# Patient Record
Sex: Female | Born: 1945 | Race: White | Hispanic: No | Marital: Married | Smoking: Former smoker
Health system: Southern US, Community
[De-identification: ages and names within clinical notes are randomized; demographics above are authoritative.]

## PROBLEM LIST (undated history)

## (undated) DIAGNOSIS — K5792 Diverticulitis of intestine, part unspecified, without perforation or abscess without bleeding: Secondary | ICD-10-CM

## (undated) DIAGNOSIS — T7840XA Allergy, unspecified, initial encounter: Secondary | ICD-10-CM

## (undated) DIAGNOSIS — C801 Malignant (primary) neoplasm, unspecified: Secondary | ICD-10-CM

## (undated) DIAGNOSIS — E079 Disorder of thyroid, unspecified: Secondary | ICD-10-CM

## (undated) HISTORY — DX: Diverticulitis of intestine, part unspecified, without perforation or abscess without bleeding: K57.92

## (undated) HISTORY — DX: Disorder of thyroid, unspecified: E07.9

## (undated) HISTORY — DX: Malignant (primary) neoplasm, unspecified: C80.1

## (undated) HISTORY — PX: PARATHYROIDECTOMY: SHX19

## (undated) HISTORY — DX: Allergy, unspecified, initial encounter: T78.40XA

---

## 1995-08-25 HISTORY — PX: BREAST BIOPSY: SHX20

## 2008-08-24 DIAGNOSIS — C801 Malignant (primary) neoplasm, unspecified: Secondary | ICD-10-CM

## 2008-08-24 HISTORY — DX: Malignant (primary) neoplasm, unspecified: C80.1

## 2008-08-24 HISTORY — PX: THYROIDECTOMY: SHX17

## 2012-01-28 ENCOUNTER — Ambulatory Visit: Payer: Self-pay | Admitting: Internal Medicine

## 2012-01-28 LAB — HM DEXA SCAN

## 2012-01-28 LAB — HM MAMMOGRAPHY: HM MAMMO: NEGATIVE

## 2012-06-17 LAB — HM COLONOSCOPY

## 2012-07-18 ENCOUNTER — Ambulatory Visit: Payer: Self-pay | Admitting: Gastroenterology

## 2013-10-27 DIAGNOSIS — C73 Malignant neoplasm of thyroid gland: Secondary | ICD-10-CM | POA: Diagnosis not present

## 2013-11-06 DIAGNOSIS — C73 Malignant neoplasm of thyroid gland: Secondary | ICD-10-CM | POA: Diagnosis not present

## 2013-11-06 DIAGNOSIS — E890 Postprocedural hypothyroidism: Secondary | ICD-10-CM | POA: Diagnosis not present

## 2014-05-25 DIAGNOSIS — Z23 Encounter for immunization: Secondary | ICD-10-CM | POA: Diagnosis not present

## 2014-06-19 ENCOUNTER — Ambulatory Visit (INDEPENDENT_AMBULATORY_CARE_PROVIDER_SITE_OTHER): Payer: Medicare Other | Admitting: Family Medicine

## 2014-06-19 ENCOUNTER — Encounter (INDEPENDENT_AMBULATORY_CARE_PROVIDER_SITE_OTHER): Payer: Self-pay

## 2014-06-19 ENCOUNTER — Encounter: Payer: Self-pay | Admitting: Family Medicine

## 2014-06-19 VITALS — BP 118/68 | HR 71 | Temp 97.9°F | Ht 64.25 in | Wt 145.2 lb

## 2014-06-19 DIAGNOSIS — K5730 Diverticulosis of large intestine without perforation or abscess without bleeding: Secondary | ICD-10-CM

## 2014-06-19 DIAGNOSIS — E039 Hypothyroidism, unspecified: Secondary | ICD-10-CM | POA: Diagnosis not present

## 2014-06-19 DIAGNOSIS — Z85850 Personal history of malignant neoplasm of thyroid: Secondary | ICD-10-CM

## 2014-06-19 DIAGNOSIS — Z8639 Personal history of other endocrine, nutritional and metabolic disease: Secondary | ICD-10-CM

## 2014-06-19 DIAGNOSIS — M810 Age-related osteoporosis without current pathological fracture: Secondary | ICD-10-CM | POA: Diagnosis not present

## 2014-06-19 NOTE — Assessment & Plan Note (Signed)
Followed by Dr. Gabriel Carina.  On thyroid replacement.  Clinically euthyroid.

## 2014-06-19 NOTE — Patient Instructions (Addendum)
It was great to meet you. Please return tomorrow for your welcome to medicare physical at 9:30.

## 2014-06-19 NOTE — Progress Notes (Signed)
Pre visit review using our clinic review tool, if applicable. No additional management support is needed unless otherwise documented below in the visit note. 

## 2014-06-19 NOTE — Progress Notes (Signed)
Subjective:    Patient ID: Amber Andrews, female    DOB: [birth year], 68 y.o.   MRN: [id number]  HPI  Very pleasant retired Therapist, sports here to establish care.  Osteoporosis- Diagnosed with osteoporosis in 2009- dramatic decrease in bone density and started on Fosamax.  Has been on it since. Last bone density was 01/28/2012.  At that time, she was found to have hyperparathyroidism which likely contributed to her low bone mass.  Has never had any fractures. Very active- walks daily.  Healthy, balanced diet.  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 Now on thyroid replacement therapy and followed by Dr. Gabriel Carina.  Was being seen twice a year but now yearly since she just passed the 5 year mark of her cancer diagnosis.   She travels with her husband- going to [place] and [place] 4 months this winter.  Diverticulosis- diagnosed on colonoscopy in 2013.  Takes fiber supplement which has helped.  Denies changes in her bowel habits or blood in her stool.  Due for medicare wellness visit before 06/24/2014  No current outpatient prescriptions on file prior to visit.   No current facility-administered medications on file prior to visit.    Allergies  Allergen Reactions  . Sulfa Antibiotics Hives    Past Medical History  Diagnosis Date  . Cancer 2010    thyroid  . Diverticulitis   . Allergy   . Thyroid disease     Past Surgical History  Procedure Laterality Date  . Breast biopsy  1997  . Thyroidectomy  2010  . Parathyroidectomy      Family History  Problem Relation Age of Onset  . Arthritis Mother   . Hypertension Mother   . Hypertension Father   . Arthritis Sister   . Cancer Maternal Uncle   . Cancer Paternal Uncle   . Heart disease Maternal Grandfather     History   Social History  . Marital Status: Married    Spouse Name: N/A    Number of Children: N/A  . Years of Education: N/A   Occupational History  . Not on file.    Social History Main Topics  . Smoking status: Former Research scientist (life sciences)  . Smokeless tobacco: Never Used  . Alcohol Use: Yes  . Drug Use: No  . Sexual Activity: Yes   Other Topics Concern  . Not on file   Social History Narrative  . No narrative on file   The PMH, PSH, Social History, Family History, Medications, and allergies have been reviewed in [facility], and have been updated if relevant.   Review of Systems  Constitutional: Negative.   HENT: Negative.   Respiratory: Negative.   Cardiovascular: Negative.   Gastrointestinal: Negative.   Psychiatric/Behavioral: Negative.   All other systems reviewed and are negative.      Objective:   Physical Exam  Nursing note and vitals reviewed. Constitutional: She is oriented to person, place, and time. She appears well-developed and well-nourished. No distress.  HENT:  Head: Normocephalic.  Cardiovascular: Normal rate.   Pulmonary/Chest: Effort normal.  Musculoskeletal: Normal range of motion.  Neurological: She is alert and oriented to person, place, and time.  Skin: Skin is warm and dry.  Psychiatric: She has a normal mood and affect. Her behavior is normal. Thought content normal.   BP 118/68  Pulse 71  Temp(Src) 97.9 F (36.6 C) (Oral)  Ht 5' 4.25" (1.632 m)  Wt 145 lb 4 oz (65.885 kg)  BMI 24.74 kg/m2  SpO2 97%        Assessment & Plan:

## 2014-06-19 NOTE — Assessment & Plan Note (Signed)
Colonoscopy reassuring. Asymptomatic.

## 2014-06-19 NOTE — Assessment & Plan Note (Signed)
Due for DEXA.  Can likely stop fosamax at this point given that she has been taking it since 2009.

## 2014-06-20 ENCOUNTER — Encounter: Payer: Self-pay | Admitting: Family Medicine

## 2014-06-20 ENCOUNTER — Ambulatory Visit (INDEPENDENT_AMBULATORY_CARE_PROVIDER_SITE_OTHER): Payer: Medicare Other | Admitting: Family Medicine

## 2014-06-20 ENCOUNTER — Other Ambulatory Visit (HOSPITAL_COMMUNITY)
Admission: RE | Admit: 2014-06-20 | Discharge: 2014-06-20 | Disposition: A | Discharge status: Home / Self Care | Payer: Medicare Other | Source: Ambulatory Visit | Attending: Family Medicine | Admitting: Family Medicine

## 2014-06-20 VITALS — BP 114/70 | HR 80 | Temp 97.7°F | Ht 64.25 in | Wt 143.5 lb

## 2014-06-20 DIAGNOSIS — Z0000 Encounter for general adult medical examination without abnormal findings: Secondary | ICD-10-CM | POA: Diagnosis not present

## 2014-06-20 DIAGNOSIS — Z23 Encounter for immunization: Secondary | ICD-10-CM | POA: Diagnosis not present

## 2014-06-20 DIAGNOSIS — Z85850 Personal history of malignant neoplasm of thyroid: Secondary | ICD-10-CM

## 2014-06-20 DIAGNOSIS — Z124 Encounter for screening for malignant neoplasm of cervix: Secondary | ICD-10-CM

## 2014-06-20 DIAGNOSIS — Z8639 Personal history of other endocrine, nutritional and metabolic disease: Secondary | ICD-10-CM

## 2014-06-20 DIAGNOSIS — Z13220 Encounter for screening for lipoid disorders: Secondary | ICD-10-CM

## 2014-06-20 DIAGNOSIS — K5730 Diverticulosis of large intestine without perforation or abscess without bleeding: Secondary | ICD-10-CM

## 2014-06-20 DIAGNOSIS — E039 Hypothyroidism, unspecified: Secondary | ICD-10-CM

## 2014-06-20 DIAGNOSIS — M810 Age-related osteoporosis without current pathological fracture: Secondary | ICD-10-CM

## 2014-06-20 DIAGNOSIS — Z1231 Encounter for screening mammogram for malignant neoplasm of breast: Secondary | ICD-10-CM

## 2014-06-20 DIAGNOSIS — Z1151 Encounter for screening for human papillomavirus (HPV): Secondary | ICD-10-CM | POA: Diagnosis not present

## 2014-06-20 LAB — CBC WITH DIFFERENTIAL/PLATELET
BASOS ABS: 0 10*3/uL (ref 0.0–0.1)
Basophils Relative: 0.3 % (ref 0.0–3.0)
EOS ABS: 0.1 10*3/uL (ref 0.0–0.7)
Eosinophils Relative: 1.2 % (ref 0.0–5.0)
HCT: 41.5 % (ref 36.0–46.0)
Hemoglobin: 13.7 g/dL (ref 12.0–15.0)
LYMPHS PCT: 30.2 % (ref 12.0–46.0)
Lymphs Abs: 1.4 10*3/uL (ref 0.7–4.0)
MCHC: 33 g/dL (ref 30.0–36.0)
MCV: 93.7 fl (ref 78.0–100.0)
MONOS PCT: 7.7 % (ref 3.0–12.0)
Monocytes Absolute: 0.4 10*3/uL (ref 0.1–1.0)
NEUTROS PCT: 60.6 % (ref 43.0–77.0)
Neutro Abs: 2.9 10*3/uL (ref 1.4–7.7)
Platelets: 237 10*3/uL (ref 150.0–400.0)
RBC: 4.43 Mil/uL (ref 3.87–5.11)
RDW: 13.7 % (ref 11.5–15.5)
WBC: 4.8 10*3/uL (ref 4.0–10.5)

## 2014-06-20 LAB — LIPID PANEL
Cholesterol: 232 mg/dL — ABNORMAL HIGH (ref 0–200)
HDL: 92.1 mg/dL (ref >39.00)
LDL Cholesterol: 123 mg/dL — ABNORMAL HIGH (ref 0–99)
NonHDL: 139.9
Total CHOL/HDL Ratio: 3
Triglycerides: 87 mg/dL (ref 0.0–149.0)
VLDL: 17.4 mg/dL (ref 0.0–40.0)

## 2014-06-20 LAB — COMPREHENSIVE METABOLIC PANEL
ALT: 16 U/L (ref 0–35)
AST: 19 U/L (ref 0–37)
Albumin: 3.5 g/dL (ref 3.5–5.2)
Alkaline Phosphatase: 33 U/L — ABNORMAL LOW (ref 39–117)
BUN: 11 mg/dL (ref 6–23)
CALCIUM: 8.7 mg/dL (ref 8.4–10.5)
CO2: 26 meq/L (ref 19–32)
Chloride: 104 mEq/L (ref 96–112)
Creatinine, Ser: 0.8 mg/dL (ref 0.4–1.2)
GFR: 78.55 mL/min (ref >60.00)
Glucose, Bld: 83 mg/dL (ref 70–99)
POTASSIUM: 3.7 meq/L (ref 3.5–5.1)
SODIUM: 138 meq/L (ref 135–145)
TOTAL PROTEIN: 6.8 g/dL (ref 6.0–8.3)
Total Bilirubin: 0.9 mg/dL (ref 0.2–1.2)

## 2014-06-20 LAB — TSH: TSH: 0.18 u[IU]/mL — AB (ref 0.35–4.50)

## 2014-06-20 NOTE — Progress Notes (Signed)
Pre visit review using our clinic review tool, if applicable. No additional management support is needed unless otherwise documented below in the visit note. 

## 2014-06-20 NOTE — Patient Instructions (Signed)
Great to see you. We will call you with your lab results and you can view them online. Please stop by to see Rosaria Ferries to set up your mammogram and bone density. Safe and happy travels!

## 2014-06-20 NOTE — Assessment & Plan Note (Signed)
Followed by Dr. Gabriel Carina. Clinically euthyroid.

## 2014-06-20 NOTE — Assessment & Plan Note (Signed)
Reviewed preventive care protocols, scheduled due services, and updated immunizations Discussed nutrition, exercise, diet, and healthy lifestyle.  EKG- NSR with some nonspecific T waves changes- no prior EKG for comparison but this is likely a normal variant.  No CP, SOB or dizziness- even at high altitudes during her recent hikes and travels.  Pneumovax given today.  Mammogram and DEXA ordered. Orders Placed This Encounter  Procedures  . DG Bone Density  . MM Digital Screening  . Pneumococcal polysaccharide vaccine 23-valent greater than or equal to 2yo subcutaneous/IM  . CBC with Differential  . Comprehensive metabolic panel  . Lipid panel  . TSH  . EKG 12-Lead

## 2014-06-20 NOTE — Assessment & Plan Note (Signed)
On fosamax. Due for drug holiday from bisphosphonate.  Will recheck DEXA. Order placed.

## 2014-06-20 NOTE — Addendum Note (Signed)
Addended by: Modena Nunnery on: 06/20/2014 10:28 AM   Modules accepted: Orders

## 2014-06-20 NOTE — Progress Notes (Signed)
Subjective:    Patient ID: Amber Andrews, female    DOB: 1946-01-25, 68 y.o.   MRN: [id number]  HPI  Very pleasant 68 yo female who established care with me yesterday here for Welcome to Medicare Physical.  I have personally reviewed the Medicare Annual Wellness questionnaire and have noted 1. The patient's medical and social history 2. Their use of alcohol, tobacco or illicit drugs 3. Their current medications and supplements 4. The patient's functional ability including ADL's, fall risks, home safety risks and hearing or visual             impairment. 5. Diet and physical activities 6. Evidence for depression or mood disorders  End of life wishes discussed and updated in Social History.  The roster of all physicians providing medical care to patient - is listed in the Snapshot section of the chart.  Zoster 11/14/12 Flu 05/24/14 Colonoscopy 06/17/12- Dr. Candace Cruise, 10 year recall  No h/o abnormal pap smears. Did have some postmenopausal bleeding year ago- per pt, endometrial biopsy neg.  Osteoporosis- Diagnosed with osteoporosis in 2009- dramatic decrease in bone density and started on Fosamax.  Has been on it since. Last bone density was 01/28/2012.  At that time, she was found to have hyperparathyroidism which likely contributed to her low bone mass.  Has never had any fractures. Very active- walks daily.  Healthy, balanced diet.  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 Now on thyroid replacement therapy and followed by Dr. Gabriel Carina.  Was being seen twice a year but now yearly since she just passed the 5 year mark of her cancer diagnosis.   She travels with her husband- going to [place] and [place] 4 months this winter.  Diverticulosis- diagnosed on colonoscopy in 2013.  Takes fiber supplement which has helped.  Denies changes in her bowel habits or blood in her stool.   Current Outpatient Prescriptions on File Prior to Visit    Medication Sig Dispense Refill  . alendronate (FOSAMAX) 70 MG tablet Take 70 mg by mouth once a week. Take with a full glass of water on an empty stomach.      Marland Kitchen CALCIUM CITRATE PO Take 2 tablets by mouth daily. 630 mg      . FIBER PO Take by mouth daily.      Marland Kitchen levothyroxine (SYNTHROID, LEVOTHROID) 112 MCG tablet Take 112 mcg by mouth 3 (three) times a week. Take one tab Friday, Saturday and Sunday      . levothyroxine (SYNTHROID, LEVOTHROID) 125 MCG tablet Take 125 mcg by mouth 4 (four) times a week. Take one tab daily Monday through Thursday.      . Multiple Vitamins-Minerals (CENTRUM SILVER ADULT 50+) TABS Take 1 tablet by mouth daily.      . Omega-3 Fatty Acids (FISH OIL PO) Take 1 capsule by mouth.       No current facility-administered medications on file prior to visit.    Allergies  Allergen Reactions  . Sulfa Antibiotics Hives    Past Medical History  Diagnosis Date  . Cancer 2010    thyroid  . Diverticulitis   . Allergy   . Thyroid disease     Past Surgical History  Procedure Laterality Date  . Breast biopsy  1997  . Thyroidectomy  2010  . Parathyroidectomy      Family History  Problem Relation Age of Onset  . Arthritis Mother   . Hypertension Mother   . Hypertension Father   .  Arthritis Sister   . Cancer Maternal Uncle   . Cancer Paternal Uncle   . Heart disease Maternal Grandfather     History   Social History  . Marital Status: Married    Spouse Name: N/A    Number of Children: N/A  . Years of Education: N/A   Occupational History  . Not on file.   Social History Main Topics  . Smoking status: Former Research scientist (life sciences)  . Smokeless tobacco: Never Used  . Alcohol Use: Yes  . Drug Use: No  . Sexual Activity: Yes   Other Topics Concern  . Not on file   Social History Narrative  . No narrative on file   The PMH, PSH, Social History, Family History, Medications, and allergies have been reviewed in [facility], and have been updated if relevant.   Review  of Systems  Constitutional: Negative.   HENT: Negative.   Respiratory: Negative.   Cardiovascular: Negative.  Negative for chest pain, palpitations and leg swelling.  Gastrointestinal: Negative.  Negative for abdominal pain, constipation, blood in stool, abdominal distention and anal bleeding.  Endocrine: Negative.   Genitourinary: Negative.   Musculoskeletal: Negative.   Skin: Negative.   Allergic/Immunologic: Negative.   Psychiatric/Behavioral: Negative.   All other systems reviewed and are negative.      Objective:   Physical Exam  Nursing note and vitals reviewed. Constitutional: She is oriented to person, place, and time. She appears well-developed and well-nourished. No distress.  HENT:  Head: Normocephalic.  Cardiovascular: Normal rate.   Pulmonary/Chest: Effort normal.  Abdominal: Hernia confirmed negative in the right inguinal area and confirmed negative in the left inguinal area.  Genitourinary: Rectum normal, vagina normal and uterus normal. No breast swelling, tenderness, discharge or bleeding. Pelvic exam was performed with patient prone. There is no rash, tenderness or lesion on the right labia. There is no rash, tenderness or lesion on the left labia. Cervix exhibits no motion tenderness, no discharge and no friability. Right adnexum displays no mass, no tenderness and no fullness. Left adnexum displays no mass, no tenderness and no fullness.  Musculoskeletal: Normal range of motion.  Lymphadenopathy:       Right: No inguinal adenopathy present.       Left: No inguinal adenopathy present.  Neurological: She is alert and oriented to person, place, and time.  Skin: Skin is warm and dry.  Psychiatric: She has a normal mood and affect. Her behavior is normal. Thought content normal.   BP 114/70  Pulse 80  Temp(Src) 97.7 F (36.5 C) (Oral)  Ht 5' 4.25" (1.632 m)  Wt 143 lb 8 oz (65.091 kg)  BMI 24.44 kg/m2  SpO2 95%        Assessment & Plan:

## 2014-06-21 ENCOUNTER — Encounter: Payer: Self-pay | Admitting: *Deleted

## 2014-06-21 LAB — CYTOLOGY - PAP

## 2014-06-22 ENCOUNTER — Encounter: Payer: Self-pay | Admitting: *Deleted

## 2014-07-04 ENCOUNTER — Ambulatory Visit: Payer: Self-pay | Admitting: Family Medicine

## 2014-07-04 ENCOUNTER — Encounter: Payer: Self-pay | Admitting: Family Medicine

## 2014-07-04 DIAGNOSIS — Z1231 Encounter for screening mammogram for malignant neoplasm of breast: Secondary | ICD-10-CM | POA: Diagnosis not present

## 2014-07-04 DIAGNOSIS — M810 Age-related osteoporosis without current pathological fracture: Secondary | ICD-10-CM | POA: Diagnosis not present

## 2014-07-04 DIAGNOSIS — Z13820 Encounter for screening for osteoporosis: Secondary | ICD-10-CM | POA: Diagnosis not present

## 2014-07-04 DIAGNOSIS — Z780 Asymptomatic menopausal state: Secondary | ICD-10-CM | POA: Diagnosis not present

## 2014-07-05 ENCOUNTER — Encounter: Payer: Self-pay | Admitting: Family Medicine

## 2014-07-06 ENCOUNTER — Encounter: Payer: Self-pay | Admitting: Family Medicine

## 2014-11-29 ENCOUNTER — Other Ambulatory Visit: Payer: Self-pay

## 2014-11-29 DIAGNOSIS — Z85850 Personal history of malignant neoplasm of thyroid: Secondary | ICD-10-CM | POA: Diagnosis not present

## 2014-11-29 NOTE — Telephone Encounter (Signed)
Pt left note requesting refill fosamax to [facility]. Pt last annual exam 06/20/14 and last Dexa 07/04/14.Please advise.

## 2014-11-30 MED ORDER — ALENDRONATE SODIUM 70 MG PO TABS: 70.0000 mg | ORAL_TABLET | ORAL | Status: DC

## 2014-12-19 DIAGNOSIS — E890 Postprocedural hypothyroidism: Secondary | ICD-10-CM | POA: Diagnosis not present

## 2014-12-19 DIAGNOSIS — Z85850 Personal history of malignant neoplasm of thyroid: Secondary | ICD-10-CM | POA: Diagnosis not present

## 2014-12-21 ENCOUNTER — Encounter: Payer: Self-pay | Admitting: Family Medicine

## 2014-12-21 MED ORDER — ALENDRONATE SODIUM 70 MG PO TABS
70.0000 mg | ORAL_TABLET | ORAL | Status: DC
Start: 2014-12-21 — End: 2015-03-06

## 2015-02-19 DIAGNOSIS — Z85850 Personal history of malignant neoplasm of thyroid: Secondary | ICD-10-CM | POA: Diagnosis not present

## 2015-02-27 DIAGNOSIS — Z85850 Personal history of malignant neoplasm of thyroid: Secondary | ICD-10-CM | POA: Diagnosis not present

## 2015-02-27 DIAGNOSIS — E890 Postprocedural hypothyroidism: Secondary | ICD-10-CM | POA: Diagnosis not present

## 2015-02-27 DIAGNOSIS — L659 Nonscarring hair loss, unspecified: Secondary | ICD-10-CM | POA: Diagnosis not present

## 2015-03-06 ENCOUNTER — Other Ambulatory Visit: Payer: Self-pay | Admitting: Family Medicine

## 2015-07-02 DIAGNOSIS — Z23 Encounter for immunization: Secondary | ICD-10-CM | POA: Diagnosis not present

## 2015-10-07 ENCOUNTER — Telehealth: Payer: Self-pay | Admitting: Family Medicine

## 2015-10-07 NOTE — Telephone Encounter (Signed)
Pt made cpx appointment for may 1 and would like to do labs somewhere else She would like to pick up order for labs around 4/10 when she will be in town/

## 2015-10-10 NOTE — Telephone Encounter (Signed)
Order written and in my box. 

## 2015-10-10 NOTE — Telephone Encounter (Signed)
Lm on pts vm and informed her orders are available for pickup from the front desk 

## 2015-11-25 DIAGNOSIS — E890 Postprocedural hypothyroidism: Secondary | ICD-10-CM | POA: Diagnosis not present

## 2015-11-25 DIAGNOSIS — Z85850 Personal history of malignant neoplasm of thyroid: Secondary | ICD-10-CM | POA: Diagnosis not present

## 2015-12-02 ENCOUNTER — Encounter: Payer: Medicare Other | Admitting: Family Medicine

## 2015-12-02 DIAGNOSIS — H43813 Vitreous degeneration, bilateral: Secondary | ICD-10-CM | POA: Diagnosis not present

## 2015-12-02 DIAGNOSIS — Z85850 Personal history of malignant neoplasm of thyroid: Secondary | ICD-10-CM | POA: Diagnosis not present

## 2015-12-02 DIAGNOSIS — E890 Postprocedural hypothyroidism: Secondary | ICD-10-CM | POA: Diagnosis not present

## 2015-12-13 DIAGNOSIS — E039 Hypothyroidism, unspecified: Secondary | ICD-10-CM | POA: Diagnosis not present

## 2015-12-13 DIAGNOSIS — Z0000 Encounter for general adult medical examination without abnormal findings: Secondary | ICD-10-CM | POA: Diagnosis not present

## 2015-12-23 ENCOUNTER — Ambulatory Visit (INDEPENDENT_AMBULATORY_CARE_PROVIDER_SITE_OTHER): Payer: Medicare Other | Admitting: Family Medicine

## 2015-12-23 ENCOUNTER — Encounter: Payer: Self-pay | Admitting: Family Medicine

## 2015-12-23 VITALS — BP 112/76 | HR 64 | Temp 97.6°F | Ht 64.0 in | Wt 138.5 lb

## 2015-12-23 DIAGNOSIS — Z0000 Encounter for general adult medical examination without abnormal findings: Secondary | ICD-10-CM | POA: Insufficient documentation

## 2015-12-23 DIAGNOSIS — Z8639 Personal history of other endocrine, nutritional and metabolic disease: Secondary | ICD-10-CM

## 2015-12-23 DIAGNOSIS — K5730 Diverticulosis of large intestine without perforation or abscess without bleeding: Secondary | ICD-10-CM | POA: Diagnosis not present

## 2015-12-23 DIAGNOSIS — Z23 Encounter for immunization: Secondary | ICD-10-CM | POA: Diagnosis not present

## 2015-12-23 DIAGNOSIS — Z85850 Personal history of malignant neoplasm of thyroid: Secondary | ICD-10-CM

## 2015-12-23 DIAGNOSIS — E039 Hypothyroidism, unspecified: Secondary | ICD-10-CM

## 2015-12-23 DIAGNOSIS — M810 Age-related osteoporosis without current pathological fracture: Secondary | ICD-10-CM

## 2015-12-23 MED ORDER — ALENDRONATE SODIUM 70 MG PO TABS: ORAL_TABLET | ORAL | Status: AC

## 2015-12-23 NOTE — Patient Instructions (Signed)
Good to see you. We are requesting your records. Please call the breast center to schedule your bone density and mammogram around 06/2016.  Safe travels!

## 2015-12-23 NOTE — Addendum Note (Signed)
Addended by: Lucille Passy on: 12/23/2015 11:21 AM   Modules accepted: Miquel Dunn

## 2015-12-23 NOTE — Assessment & Plan Note (Signed)
Continue fosamax. Due for DEXA this fall.

## 2015-12-23 NOTE — Assessment & Plan Note (Signed)
Followed by Dr. Solum. Clinically euthyroid. 

## 2015-12-23 NOTE — Progress Notes (Signed)
Subjective:    Patient ID: Amber Andrews, female    DOB: May 29, 1946, 70 y.o.   MRN: [id number]  HPI  Very pleasant 70 up female here for annual medicare wellness visit.  I have personally reviewed the Medicare Annual Wellness questionnaire and have noted 1. The patient's medical and social history 2. Their use of alcohol, tobacco or illicit drugs 3. Their current medications and supplements 4. The patient's functional ability including ADL's, fall risks, home safety risks and hearing or visual             impairment. 5. Diet and physical activities 6. Evidence for depression or mood disorders  End of life wishes discussed and updated in Social History.  The roster of all physicians providing medical care to patient - is listed in the Snapshot section of the chart.  Zoster 11/14/12 Pneumovax 06/20/14 Colonoscopy 06/17/12- Dr. Candace Cruise, 10 year recall Mammogram 07/04/14  No h/o abnormal pap smears. Last pap smear was done by me on 06/20/14 Did have some postmenopausal bleeding years ago- per pt, endometrial biopsy neg.  Osteoporosis- Diagnosed with osteoporosis in 2009- dramatic decrease in bone density and started on Fosamax.  Has been on it since. Last bone density was 07/04/14- osteopenia  Has never had any fractures. Very active- walks daily.  Healthy, balanced diet.  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 Now on thyroid replacement therapy and followed by Dr. Gabriel Carina yearly. Lab Results  Component Value Date   TSH 0.18* 06/20/2014     Current Outpatient Prescriptions on File Prior to Visit  Medication Sig Dispense Refill  . alendronate (FOSAMAX) 70 MG tablet TAKE 1 TABLET EVERY WEEK WITH A FULL GLASS OF WATER ON AN EMPTY STOMACH 12 tablet 1  . CALCIUM CITRATE PO Take 2 tablets by mouth daily. 630 mg    . FIBER PO Take by mouth daily.    Marland Kitchen levothyroxine (SYNTHROID, LEVOTHROID) 112 MCG tablet Take 112 mcg by mouth 3 (three)  times a week. Take one tab Friday, Saturday and Sunday    . Multiple Vitamins-Minerals (CENTRUM SILVER ADULT 50+) TABS Take 1 tablet by mouth daily.    . Omega-3 Fatty Acids (FISH OIL PO) Take 1 capsule by mouth.     No current facility-administered medications on file prior to visit.    Allergies  Allergen Reactions  . Sulfa Antibiotics Hives    Past Medical History  Diagnosis Date  . Cancer ([place]) 2010    thyroid  . Diverticulitis   . Allergy   . Thyroid disease     Past Surgical History  Procedure Laterality Date  . Breast biopsy  1997  . Thyroidectomy  2010  . Parathyroidectomy      Family History  Problem Relation Age of Onset  . Arthritis Mother   . Hypertension Mother   . Hypertension Father   . Arthritis Sister   . Cancer Maternal Uncle   . Cancer Paternal Uncle   . Heart disease Maternal Grandfather     Social History   Social History  . Marital Status: Married    Spouse Name: N/A  . Number of Children: N/A  . Years of Education: N/A   Occupational History  . Not on file.   Social History Main Topics  . Smoking status: Former Research scientist (life sciences)  . Smokeless tobacco: Never Used  . Alcohol Use: Yes  . Drug Use: No  . Sexual Activity: Yes   Other Topics Concern  . Not on  file   Social History Narrative   Has a living will   Desires CPR but no measures to prolong life if futile.   Husband and children aware of her wishes.      Retired Therapist, sports.   Loves to travel with her husband.   The PMH, PSH, Social History, Family History, Medications, and allergies have been reviewed in [facility], and have been updated if relevant.   Review of Systems  Constitutional: Negative.   HENT: Negative.   Respiratory: Negative.   Cardiovascular: Negative.  Negative for chest pain, palpitations and leg swelling.  Gastrointestinal: Negative.  Negative for abdominal pain, constipation, blood in stool, abdominal distention and anal bleeding.  Endocrine: Negative.   Genitourinary:  Negative.   Musculoskeletal: Negative.   Skin: Negative.   Allergic/Immunologic: Negative.   Psychiatric/Behavioral: Negative.   All other systems reviewed and are negative.      Objective:   Physical Exam  Constitutional: She is oriented to person, place, and time. She appears well-developed and well-nourished. No distress.  HENT:  Head: Normocephalic and atraumatic.  Right Ear: External ear normal.  Left Ear: External ear normal.  Eyes: Conjunctivae and EOM are normal.  Neck: Normal range of motion.  Cardiovascular: Normal rate.   Pulmonary/Chest: Effort normal.  Abdominal: Soft. She exhibits no distension. There is no tenderness. There is no rebound and no guarding.  Genitourinary: Rectum normal. Pelvic exam was performed with patient prone.  Musculoskeletal: Normal range of motion.  Neurological: She is alert and oriented to person, place, and time. No cranial nerve deficit.  Skin: Skin is warm and dry.  Psychiatric: She has a normal mood and affect. Her behavior is normal. Thought content normal.  Nursing note and vitals reviewed.  BP 112/76 mmHg  Pulse 64  Temp(Src) 97.6 F (36.4 C) (Oral)  Ht 5\' 4"  (1.626 m)  Wt 138 lb 8 oz (62.823 kg)  BMI 23.76 kg/m2  SpO2 98%        Assessment & Plan:

## 2015-12-23 NOTE — Assessment & Plan Note (Signed)
The patients weight, height, BMI and visual acuity have been recorded in the chart.  Cognitive function assessed.   I have made referrals, counseling and provided education to the patient based review of the above and I have provided the pt with a written personalized care plan for preventive services.  Prevnar 13 given today.   

## 2016-05-24 DIAGNOSIS — Z23 Encounter for immunization: Secondary | ICD-10-CM | POA: Diagnosis not present

## 2016-05-26 DIAGNOSIS — Z1231 Encounter for screening mammogram for malignant neoplasm of breast: Secondary | ICD-10-CM | POA: Diagnosis not present

## 2016-05-26 DIAGNOSIS — Z780 Asymptomatic menopausal state: Secondary | ICD-10-CM | POA: Diagnosis not present

## 2016-05-26 DIAGNOSIS — M8589 Other specified disorders of bone density and structure, multiple sites: Secondary | ICD-10-CM | POA: Diagnosis not present

## 2016-06-03 ENCOUNTER — Encounter: Payer: Self-pay | Admitting: Family Medicine

## 2016-06-03 ENCOUNTER — Encounter: Payer: Self-pay | Admitting: *Deleted

## 2016-06-16 ENCOUNTER — Encounter: Payer: Self-pay | Admitting: Family Medicine

## 2016-06-19 ENCOUNTER — Encounter: Payer: Self-pay | Admitting: Family Medicine

## 2016-12-01 DIAGNOSIS — Z85850 Personal history of malignant neoplasm of thyroid: Secondary | ICD-10-CM | POA: Diagnosis not present

## 2016-12-01 DIAGNOSIS — E890 Postprocedural hypothyroidism: Secondary | ICD-10-CM | POA: Diagnosis not present

## 2016-12-08 DIAGNOSIS — E890 Postprocedural hypothyroidism: Secondary | ICD-10-CM | POA: Diagnosis not present

## 2016-12-08 DIAGNOSIS — Z85850 Personal history of malignant neoplasm of thyroid: Secondary | ICD-10-CM | POA: Diagnosis not present

## 2017-01-13 DIAGNOSIS — M722 Plantar fascial fibromatosis: Secondary | ICD-10-CM | POA: Diagnosis not present

## 2017-01-13 DIAGNOSIS — M21962 Unspecified acquired deformity of left lower leg: Secondary | ICD-10-CM | POA: Diagnosis not present

## 2017-01-20 DIAGNOSIS — M21962 Unspecified acquired deformity of left lower leg: Secondary | ICD-10-CM | POA: Diagnosis not present

## 2017-01-20 DIAGNOSIS — M722 Plantar fascial fibromatosis: Secondary | ICD-10-CM | POA: Diagnosis not present

## 2017-03-30 ENCOUNTER — Telehealth: Payer: Self-pay | Admitting: Family Medicine

## 2017-03-30 NOTE — Telephone Encounter (Signed)
Left pt message asking to call Allison back directly at [phone] to schedule AWV + labs with Lesia and CPE with PCP. ° °*NOTE* Last AWV 12/23/15 °

## 2017-05-05 NOTE — Telephone Encounter (Signed)
Left pt message asking to call Allison back directly at [phone] to schedule AWV + labs with Lesia and CPE with PCP. ° °*NOTE* Last AWV 12/23/15 °

## 2017-05-29 DIAGNOSIS — Z23 Encounter for immunization: Secondary | ICD-10-CM | POA: Diagnosis not present

## 2017-12-08 DIAGNOSIS — K5730 Diverticulosis of large intestine without perforation or abscess without bleeding: Secondary | ICD-10-CM | POA: Diagnosis not present

## 2017-12-08 DIAGNOSIS — C73 Malignant neoplasm of thyroid gland: Secondary | ICD-10-CM | POA: Diagnosis not present

## 2017-12-08 DIAGNOSIS — E890 Postprocedural hypothyroidism: Secondary | ICD-10-CM | POA: Diagnosis not present

## 2017-12-08 DIAGNOSIS — M810 Age-related osteoporosis without current pathological fracture: Secondary | ICD-10-CM | POA: Diagnosis not present

## 2018-05-13 DIAGNOSIS — Z23 Encounter for immunization: Secondary | ICD-10-CM | POA: Diagnosis not present
